# Patient Record
Sex: Female | Born: 2000 | Race: White | Hispanic: No | Marital: Single
Health system: Southern US, Community
[De-identification: ages and names within clinical notes are randomized; demographics above are authoritative.]

---
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
## 2020-12-18 ENCOUNTER — Other Ambulatory Visit: Payer: BLUE CROSS/BLUE SHIELD

## 2020-12-18 DIAGNOSIS — Z20822 Contact with and (suspected) exposure to covid-19: Secondary | ICD-10-CM

## 2020-12-20 LAB — SARS-COV-2, NAA 2 DAY TAT

## 2020-12-20 LAB — NOVEL CORONAVIRUS, NAA: SARS-CoV-2, NAA: NOT DETECTED

## 2021-06-27 ENCOUNTER — Other Ambulatory Visit (HOSPITAL_COMMUNITY): Payer: Self-pay

## 2021-07-03 ENCOUNTER — Other Ambulatory Visit (HOSPITAL_COMMUNITY): Payer: Self-pay

## 2021-07-03 MED ORDER — DESOGESTREL-ETHINYL ESTRADIOL 0.15-30 MG-MCG PO TABS
1.0000 | ORAL_TABLET | Freq: Every day | ORAL | 3 refills | Status: AC
  Filled 2021-07-03: qty 84, 84d supply, fill #0
  Filled 2021-08-19 (×2): qty 84, 84d supply, fill #1
  Filled 2021-11-19: qty 84, 84d supply, fill #2

## 2021-07-03 MED ORDER — DESOGESTREL-ETHINYL ESTRADIOL 0.15-30 MG-MCG PO TABS
1.0000 | ORAL_TABLET | Freq: Every day | ORAL | 3 refills | Status: AC
  Filled 2021-07-03: qty 84, 84d supply, fill #0

## 2021-07-08 ENCOUNTER — Other Ambulatory Visit (HOSPITAL_COMMUNITY): Payer: Self-pay

## 2021-07-15 ENCOUNTER — Other Ambulatory Visit (HOSPITAL_COMMUNITY): Payer: Self-pay

## 2021-07-30 ENCOUNTER — Other Ambulatory Visit (HOSPITAL_COMMUNITY): Payer: Self-pay

## 2021-07-30 MED ORDER — RETIN-A MICRO PUMP 0.08 % EX GEL
CUTANEOUS | 5 refills | Status: AC
  Filled 2021-07-30: qty 50, 30d supply, fill #0

## 2021-07-30 MED ORDER — SPIRONOLACTONE 100 MG PO TABS
100.0000 mg | ORAL_TABLET | Freq: Every day | ORAL | 6 refills | Status: AC
  Filled 2021-07-30: qty 30, 30d supply, fill #0
  Filled 2021-08-19: qty 90, 90d supply, fill #1
  Filled 2021-08-19: qty 30, 30d supply, fill #2

## 2021-08-01 ENCOUNTER — Other Ambulatory Visit (HOSPITAL_COMMUNITY): Payer: Self-pay

## 2021-08-04 ENCOUNTER — Other Ambulatory Visit (HOSPITAL_COMMUNITY): Payer: Self-pay

## 2021-08-05 ENCOUNTER — Other Ambulatory Visit (HOSPITAL_COMMUNITY): Payer: Self-pay

## 2021-08-07 ENCOUNTER — Other Ambulatory Visit (HOSPITAL_COMMUNITY): Payer: Self-pay

## 2021-08-19 ENCOUNTER — Other Ambulatory Visit (HOSPITAL_COMMUNITY): Payer: Self-pay

## 2021-08-20 ENCOUNTER — Other Ambulatory Visit (HOSPITAL_COMMUNITY): Payer: Self-pay

## 2021-11-19 ENCOUNTER — Other Ambulatory Visit (HOSPITAL_COMMUNITY): Payer: Self-pay

## 2021-12-16 ENCOUNTER — Other Ambulatory Visit (HOSPITAL_COMMUNITY): Payer: Self-pay

## 2021-12-16 MED ORDER — SPIRONOLACTONE 100 MG PO TABS
100.0000 mg | ORAL_TABLET | Freq: Every day | ORAL | 3 refills | Status: AC
  Filled 2021-12-16: qty 90, 90d supply, fill #0

## 2021-12-16 MED ORDER — DESOGESTREL-ETHINYL ESTRADIOL 0.15-30 MG-MCG PO TABS
1.0000 | ORAL_TABLET | Freq: Every day | ORAL | 3 refills | Status: AC
  Filled 2021-12-16: qty 84, 84d supply, fill #0
  Filled 2022-03-16: qty 84, 84d supply, fill #1

## 2021-12-19 ENCOUNTER — Other Ambulatory Visit (HOSPITAL_COMMUNITY): Payer: Self-pay

## 2022-01-15 ENCOUNTER — Other Ambulatory Visit (HOSPITAL_COMMUNITY): Payer: Self-pay

## 2022-03-16 ENCOUNTER — Other Ambulatory Visit (HOSPITAL_COMMUNITY): Payer: Self-pay

## 2022-08-01 ENCOUNTER — Other Ambulatory Visit (HOSPITAL_COMMUNITY): Payer: Self-pay

## 2022-12-17 ENCOUNTER — Other Ambulatory Visit (HOSPITAL_COMMUNITY): Payer: Self-pay

## 2022-12-17 ENCOUNTER — Other Ambulatory Visit: Payer: Self-pay

## 2022-12-17 MED ORDER — DESOGESTREL-ETHINYL ESTRADIOL 0.15-30 MG-MCG PO TABS
1.0000 | ORAL_TABLET | Freq: Every day | ORAL | 3 refills | Status: AC
Start: 2022-12-17 — End: ?
  Filled 2022-12-17: qty 28, 28d supply, fill #0
  Filled 2022-12-17: qty 56, 56d supply, fill #0
  Filled 2022-12-18: qty 28, 28d supply, fill #0

## 2022-12-18 ENCOUNTER — Other Ambulatory Visit (HOSPITAL_COMMUNITY): Payer: Self-pay

## 2023-01-11 ENCOUNTER — Other Ambulatory Visit (HOSPITAL_COMMUNITY): Payer: Self-pay

## 2023-05-07 ENCOUNTER — Other Ambulatory Visit (HOSPITAL_COMMUNITY): Payer: Self-pay

## 2023-05-07 MED ORDER — DESOGESTREL-ETHINYL ESTRADIOL 0.15-30 MG-MCG PO TABS
1.0000 | ORAL_TABLET | Freq: Every day | ORAL | 3 refills | Status: AC
  Filled 2023-05-07: qty 84, 84d supply, fill #0

## 2023-05-07 MED ORDER — SPIRONOLACTONE 100 MG PO TABS
100.0000 mg | ORAL_TABLET | Freq: Every day | ORAL | 3 refills | Status: AC
  Filled 2023-05-07: qty 90, 90d supply, fill #0

## 2023-09-24 ENCOUNTER — Other Ambulatory Visit: Payer: Self-pay
